# Patient Record
Sex: Female | Born: 1965 | Hispanic: Yes | Marital: Married | Smoking: Never smoker
Health system: Southern US, Community
[De-identification: ages and names within clinical notes are randomized; demographics above are authoritative.]

## PROBLEM LIST (undated history)

## (undated) DIAGNOSIS — I10 Essential (primary) hypertension: Secondary | ICD-10-CM

---

## 2019-04-23 ENCOUNTER — Emergency Department
Admission: EM | Admit: 2019-04-23 | Discharge: 2019-04-23 | Disposition: A | Discharge status: Home / Self Care | Payer: HRSA Program | Attending: Emergency Medicine | Admitting: Emergency Medicine

## 2019-04-23 ENCOUNTER — Other Ambulatory Visit: Payer: Self-pay

## 2019-04-23 ENCOUNTER — Emergency Department: Payer: HRSA Program

## 2019-04-23 ENCOUNTER — Encounter: Payer: Self-pay | Admitting: Emergency Medicine

## 2019-04-23 DIAGNOSIS — R0789 Other chest pain: Secondary | ICD-10-CM | POA: Diagnosis not present

## 2019-04-23 DIAGNOSIS — J029 Acute pharyngitis, unspecified: Secondary | ICD-10-CM | POA: Diagnosis present

## 2019-04-23 DIAGNOSIS — R079 Chest pain, unspecified: Secondary | ICD-10-CM

## 2019-04-23 DIAGNOSIS — Z20828 Contact with and (suspected) exposure to other viral communicable diseases: Secondary | ICD-10-CM | POA: Insufficient documentation

## 2019-04-23 LAB — FIBRIN DERIVATIVES D-DIMER (ARMC ONLY): Fibrin derivatives D-dimer (ARMC): 570 ng/mL (FEU) — ABNORMAL HIGH (ref 0.00–499.00)

## 2019-04-23 LAB — CBC WITH DIFFERENTIAL/PLATELET
Abs Immature Granulocytes: 0.02 10*3/uL (ref 0.00–0.07)
Basophils Absolute: 0 10*3/uL (ref 0.0–0.1)
Basophils Relative: 0 %
Eosinophils Absolute: 0.1 10*3/uL (ref 0.0–0.5)
Eosinophils Relative: 1 %
HCT: 35.7 % — ABNORMAL LOW (ref 36.0–46.0)
Hemoglobin: 11.4 g/dL — ABNORMAL LOW (ref 12.0–15.0)
Immature Granulocytes: 0 %
Lymphocytes Relative: 24 %
Lymphs Abs: 2.3 10*3/uL (ref 0.7–4.0)
MCH: 27.4 pg (ref 26.0–34.0)
MCHC: 31.9 g/dL (ref 30.0–36.0)
MCV: 85.8 fL (ref 80.0–100.0)
Monocytes Absolute: 0.7 10*3/uL (ref 0.1–1.0)
Monocytes Relative: 7 %
Neutro Abs: 6.2 10*3/uL (ref 1.7–7.7)
Neutrophils Relative %: 68 %
Platelets: 438 10*3/uL — ABNORMAL HIGH (ref 150–400)
RBC: 4.16 MIL/uL (ref 3.87–5.11)
RDW: 13.4 % (ref 11.5–15.5)
WBC: 9.3 10*3/uL (ref 4.0–10.5)
nRBC: 0 % (ref 0.0–0.2)

## 2019-04-23 LAB — COMPREHENSIVE METABOLIC PANEL
ALT: 12 U/L (ref 0–44)
AST: 19 U/L (ref 15–41)
Albumin: 4.2 g/dL (ref 3.5–5.0)
Alkaline Phosphatase: 70 U/L (ref 38–126)
Anion gap: 12 (ref 5–15)
BUN: 8 mg/dL (ref 6–20)
CO2: 22 mmol/L (ref 22–32)
Calcium: 9.2 mg/dL (ref 8.9–10.3)
Chloride: 104 mmol/L (ref 98–111)
Creatinine, Ser: 0.56 mg/dL (ref 0.44–1.00)
GFR calc Af Amer: >60 mL/min (ref >60)
GFR calc non Af Amer: >60 mL/min (ref >60)
Glucose, Bld: 121 mg/dL — ABNORMAL HIGH (ref 70–99)
Potassium: 3.4 mmol/L — ABNORMAL LOW (ref 3.5–5.1)
Sodium: 138 mmol/L (ref 135–145)
Total Bilirubin: 0.9 mg/dL (ref 0.3–1.2)
Total Protein: 8.1 g/dL (ref 6.5–8.1)

## 2019-04-23 LAB — TROPONIN I: Troponin I: <0.03 ng/mL (ref <0.03)

## 2019-04-23 LAB — GROUP A STREP BY PCR: Group A Strep by PCR: NOT DETECTED

## 2019-04-23 LAB — SARS CORONAVIRUS 2 BY RT PCR (HOSPITAL ORDER, PERFORMED IN ~~LOC~~ HOSPITAL LAB): SARS Coronavirus 2: NEGATIVE

## 2019-04-23 MED ORDER — IOHEXOL 350 MG/ML SOLN
75.0000 mL | Freq: Once | INTRAVENOUS | Status: AC | PRN
  Administered 2019-04-23: 20:00:00 75 mL via INTRAVENOUS
  Filled 2019-04-23: qty 75

## 2019-04-23 NOTE — ED Notes (Signed)
PT state she went to [place] to see mother one month ago and returned 2 weeks ago.  States she has a brother that was diagnosed with CoVid and that while she was not around him, she was around another family member that had been in contact with him.  Pt reports fever this AM and states she just feels weak.

## 2019-04-23 NOTE — ED Provider Notes (Signed)
[facility] Emergency Department Provider Note  ____________________________________________   First MD Initiated Contact with Patient 04/23/19 1733     (approximate)  I have reviewed the triage vital signs and the nursing notes.   HISTORY  Chief Complaint Sore Throat    HPI Courtney Summers is a 53 y.o. female presents to the emergency department with complaints of sore throat with white patches.  Fever for several days.  Chest pain/pressure along with palpitations.  Patient states she did travel to [place] where she saw family members that had cared for her brother that was positive for COVID.  She states her sense of smell is decreased but is still there.    History reviewed. No pertinent past medical history.  There are no active problems to display for this patient.   History reviewed. No pertinent surgical history.  Prior to Admission medications   Not on File    Allergies Patient has no allergy information on record.  History reviewed. No pertinent family history.  Social History Social History   Tobacco Use  . Smoking status: Never Smoker  . Smokeless tobacco: Never Used  Substance Use Topics  . Alcohol use: Not on file  . Drug use: Not on file    Review of Systems  Constitutional: Positive fever/chills Eyes: No visual changes. ENT: Positive sore throat. Respiratory: Denies cough Cardiovascular: Positive for chest pressure Genitourinary: Negative for dysuria. Musculoskeletal: Negative for back pain. Neurological: Positive decrease in sense of  smell Skin: Negative for rash.    ____________________________________________   PHYSICAL EXAM:  VITAL SIGNS: ED Triage Vitals  Enc Vitals Group     BP 04/23/19 1642 (!) 164/105     Pulse Rate 04/23/19 1642 (!) 109     Resp 04/23/19 1642 18     Temp 04/23/19 1642 98.2 F (36.8 C)     Temp src --      SpO2 04/23/19 1642 100 %     Weight 04/23/19 1643 145 lb (65.8  kg)     Height 04/23/19 1643 5' (1.524 m)     Head Circumference --      Peak Flow --      Pain Score 04/23/19 1643 5     Pain Loc --      Pain Edu? --      Excl. in [place]? --     Constitutional: Alert and oriented. Well appearing and in no acute distress. Eyes: Conjunctivae are normal.  Head: Atraumatic. Nose: No congestion/rhinnorhea. Mouth/Throat: Mucous membranes are moist.  Throat appears normal no red patches are noted Neck:  supple no lymphadenopathy noted Cardiovascular: Normal rate, regular rhythm. Heart sounds are normal Respiratory: Normal respiratory effort.  No retractions, lungs c t a  GU: deferred Musculoskeletal: FROM all extremities, warm and well perfused Neurologic:  Normal speech and language.  Skin:  Skin is warm, dry and intact. No rash noted. Psychiatric: Mood and affect are normal. Speech and behavior are normal.  ____________________________________________   LABS (all labs ordered are listed, but only abnormal results are displayed)  Labs Reviewed  COMPREHENSIVE METABOLIC PANEL - Abnormal; Notable for the following components:      Result Value   Potassium 3.4 (*)    Glucose, Bld 121 (*)    All other components within normal limits  CBC WITH DIFFERENTIAL/PLATELET - Abnormal; Notable for the following components:   Hemoglobin 11.4 (*)    HCT 35.7 (*)    Platelets 438 (*)  All other components within normal limits  FIBRIN DERIVATIVES D-DIMER ([facility] ONLY) - Abnormal; Notable for the following components:   Fibrin derivatives D-dimer ([facility]) 570.00 (*)    All other components within normal limits  GROUP A STREP BY PCR  SARS CORONAVIRUS 2 (HOSPITAL ORDER, [facility])  TROPONIN I   ____________________________________________   ____________________________________________  RADIOLOGY  CHest x-ray is normal CTA of the chest is negative for PE ____________________________________________   PROCEDURES  Procedure(s)  performed: EKG shows normal sinus rhythm   Procedures    ____________________________________________   INITIAL IMPRESSION / ASSESSMENT AND PLAN / ED COURSE  Pertinent labs & imaging results that were available during my care of the patient were reviewed by me and considered in my medical decision making (see chart for details).   Patient is 53 year old female presents emergency department with complaints of sore throat and fever since Wednesday.  White patches in the throat.  Positive chest pressure, positive decrease in's of smell  Physical exam patient appears well.  Exam is actually unremarkable.  CBC, met C, troponin, d-dimer, strep test, COVID-19 test, chest x-ray   DDX: strep throat, uri, covid 19, nonspecific chest pain  ----------------------------------------- 8:25 PM on 04/23/2019 -----------------------------------------  CBC has slightly decreased H&H, comprehensive metabolic panel is basically normal, strep test is negative, COVID-19 test is negative, troponin is normal, d-dimer is elevated at 570.  Due to the chest pain associated with elevated d-dimer, recent travel, and increased pulse CTA for PE was ordered.  ----------------------------------------- 8:59 PM on 04/23/2019 -----------------------------------------  CTA of the chest is negative for PE.  Lungs are clear.  Explained all the labs to the patient.  Explained the CT of the chest to the patient.  She was encouraged to follow-up with her regular doctor or the acute care if not improving in 2 to 3 days.  Return emergency department worsening.  Take Tylenol or ibuprofen for pain as needed.  She was discharged in stable condition.   Courtney Summers was evaluated in Emergency Department on 04/23/2019 for the symptoms described in the history of present illness. She was evaluated in the context of the global COVID-19 pandemic, which necessitated consideration that the patient might be at risk for infection  with the SARS-CoV-2 virus that causes COVID-19. Institutional protocols and algorithms that pertain to the evaluation of patients at risk for COVID-19 are in a state of rapid change based on information released by regulatory bodies including the CDC and federal and state organizations. These policies and algorithms were followed during the patient's care in the ED.   As part of my medical decision making, I reviewed the following data within the [place] notes reviewed and incorporated, Interpreter needed, Labs reviewed see above, EKG interpreted NSR, Old chart reviewed, Radiograph reviewed chest x-ray normal, CTA of the chest negative for PE, Notes from prior ED visits and [place] Controlled Substance Database  ____________________________________________   FINAL CLINICAL IMPRESSION(S) / ED DIAGNOSES  Final diagnoses:  Nonspecific chest pain  Sore throat      NEW MEDICATIONS STARTED DURING THIS VISIT:  New Prescriptions   No medications on file     Note:  This document was prepared using Dragon voice recognition software and may include unintentional dictation errors.    Versie Starks, PA-C 04/23/19 2100    Delman Kitten, MD 04/23/19 [phone]

## 2019-04-23 NOTE — ED Triage Notes (Signed)
Pt to ER with c/o sore throat since Wednesday.  States she noted her throat was "white". Pt also c/o back pain.

## 2020-03-04 ENCOUNTER — Emergency Department
Admission: EM | Admit: 2020-03-04 | Discharge: 2020-03-05 | Disposition: A | Discharge status: Home / Self Care | Payer: Self-pay | Attending: Student in an Organized Health Care Education/Training Program | Admitting: Student in an Organized Health Care Education/Training Program

## 2020-03-04 ENCOUNTER — Emergency Department: Payer: Self-pay

## 2020-03-04 ENCOUNTER — Other Ambulatory Visit: Payer: Self-pay

## 2020-03-04 ENCOUNTER — Encounter: Payer: Self-pay | Admitting: *Deleted

## 2020-03-04 DIAGNOSIS — U071 COVID-19: Secondary | ICD-10-CM | POA: Insufficient documentation

## 2020-03-04 LAB — BASIC METABOLIC PANEL
Anion gap: 10 (ref 5–15)
BUN: 7 mg/dL (ref 6–20)
CO2: 26 mmol/L (ref 22–32)
Calcium: 9.9 mg/dL (ref 8.9–10.3)
Chloride: 101 mmol/L (ref 98–111)
Creatinine, Ser: 0.65 mg/dL (ref 0.44–1.00)
GFR calc Af Amer: >60 mL/min (ref >60)
GFR calc non Af Amer: >60 mL/min (ref >60)
Glucose, Bld: 102 mg/dL — ABNORMAL HIGH (ref 70–99)
Potassium: 4 mmol/L (ref 3.5–5.1)
Sodium: 137 mmol/L (ref 135–145)

## 2020-03-04 LAB — CBC
HCT: 42.4 % (ref 36.0–46.0)
Hemoglobin: 13.6 g/dL (ref 12.0–15.0)
MCH: 27.6 pg (ref 26.0–34.0)
MCHC: 32.1 g/dL (ref 30.0–36.0)
MCV: 86 fL (ref 80.0–100.0)
Platelets: 387 10*3/uL (ref 150–400)
RBC: 4.93 MIL/uL (ref 3.87–5.11)
RDW: 13 % (ref 11.5–15.5)
WBC: 5 10*3/uL (ref 4.0–10.5)
nRBC: 0 % (ref 0.0–0.2)

## 2020-03-04 LAB — TROPONIN I (HIGH SENSITIVITY): Troponin I (High Sensitivity): 3 ng/L (ref <18)

## 2020-03-04 MED ORDER — SODIUM CHLORIDE 0.9 % IV BOLUS
500.0000 mL | Freq: Once | INTRAVENOUS | Status: AC
  Administered 2020-03-04: 22:00:00 500 mL via INTRAVENOUS

## 2020-03-04 MED ORDER — BENZONATATE 100 MG PO CAPS: 100.0000 mg | ORAL_CAPSULE | Freq: Three times a day (TID) | ORAL | 0 refills | Status: AC | PRN

## 2020-03-04 NOTE — ED Provider Notes (Signed)
[facility] Emergency Department Provider Note  ____________________________________________  Time seen: Approximately 9:42 PM  I have reviewed the triage vital signs and the nursing notes.   HISTORY  Chief Complaint covid    HPI Courtney Summers is a 54 y.o. female that presents to the emergency department for evaluation of COVID-19.  Patient went to [facility] today for Covid testing and had a positive test.  She went to [facility] to get tested because her husband and grandchild have COVID-14.  [facility] told her that her heart rate was fast and she should come to the emergency department.  Patient states that she has had a productive cough with clear phlegm for several days.  She did have 5 episodes of vomiting today.  No fevers, nasal congestion, shortness of breath, chest pain, vomiting, abdominal pain.   No past medical history on file.  There are no problems to display for this patient.   No past surgical history on file.  Prior to Admission medications   Medication Sig Start Date End Date Taking? Authorizing Provider  benzonatate (TESSALON PERLES) 100 MG capsule Take 1 capsule (100 mg total) by mouth 3 (three) times daily as needed. 03/04/20 03/04/21  Laban Emperor, PA-C    Allergies Patient has no known allergies.  No family history on file.  Social History Social History   Tobacco Use  . Smoking status: Never Smoker  . Smokeless tobacco: Never Used  Substance Use Topics  . Alcohol use: Not Currently  . Drug use: Not Currently     Review of Systems  Constitutional: No fever/chills Eyes: No visual changes. No discharge. ENT: Negative for congestion and rhinorrhea. Cardiovascular: No chest pain. Respiratory: Positive for cough. No SOB. Gastrointestinal: No abdominal pain.  No nausea, no vomiting.  Positive for diarrhea.  No constipation. Musculoskeletal: Negative for musculoskeletal pain. Skin: Negative for rash, abrasions, lacerations,  ecchymosis. Neurological: Negative for headaches.   ____________________________________________   PHYSICAL EXAM:  VITAL SIGNS: ED Triage Vitals  Enc Vitals Group     BP 03/04/20 1933 (!) 139/94     Pulse Rate 03/04/20 1933 (!) 105     Resp 03/04/20 1933 20     Temp 03/04/20 1933 98.5 F (36.9 C)     Temp Source 03/04/20 1933 Oral     SpO2 03/04/20 1933 99 %     Weight 03/04/20 1934 160 lb (72.6 kg)     Height 03/04/20 1934 5' (1.524 m)     Head Circumference --      Peak Flow --      Pain Score --      Pain Loc --      Pain Edu? --      Excl. in [place]? --      Constitutional: Alert and oriented. Well appearing and in no acute distress. Eyes: Conjunctivae are normal. PERRL. EOMI. No discharge. Head: Atraumatic. ENT: No frontal and maxillary sinus tenderness.      Ears: Tympanic membranes pearly gray with good landmarks. No discharge.      Nose: No congestion/rhinnorhea.      Mouth/Throat: Mucous membranes are moist. Oropharynx non-erythematous. Tonsils not enlarged. No exudates. Uvula midline. Neck: No stridor.   Hematological/Lymphatic/Immunilogical: No cervical lymphadenopathy. Cardiovascular: Normal rate, regular rhythm.  Good peripheral circulation. Respiratory: Normal respiratory effort without tachypnea or retractions. Lungs CTAB. Good air entry to the bases with no decreased or absent breath sounds. Gastrointestinal: Bowel sounds 4 quadrants. Soft and nontender to palpation. No guarding or rigidity.  No palpable masses. No distention. Musculoskeletal: Full range of motion to all extremities. No gross deformities appreciated. Neurologic:  Normal speech and language. No gross focal neurologic deficits are appreciated.  Skin:  Skin is warm, dry and intact. No rash noted. Psychiatric: Mood and affect are normal. Speech and behavior are normal. Patient exhibits appropriate insight and judgement.   ____________________________________________   LABS (all labs ordered  are listed, but only abnormal results are displayed)  Labs Reviewed  BASIC METABOLIC PANEL - Abnormal; Notable for the following components:      Result Value   Glucose, Bld 102 (*)    All other components within normal limits  CBC  TROPONIN I (HIGH SENSITIVITY)   ____________________________________________  EKG   ____________________________________________  RADIOLOGY Lexine Baton, personally viewed and evaluated these images (plain radiographs) as part of my medical decision making, as well as reviewing the written report by the radiologist.  DG Chest 1 View  Result Date: 03/04/2020 CLINICAL DATA:  Initial evaluation for acute COVID infection. EXAM: CHEST  1 VIEW COMPARISON:  Prior radiograph from 04/23/2019. FINDINGS: The cardiac and mediastinal silhouettes are stable in size and contour, and remain within normal limits. The lungs are normally inflated. No airspace consolidation, pleural effusion, or pulmonary edema. No pneumothorax. No acute osseous abnormality. Mild thoracolumbar scoliosis noted. Cholecystectomy clips overlie the right upper quadrant. IMPRESSION: No radiographic evidence for active cardiopulmonary disease. Electronically Signed   By: Rise Mu M.D.   On: 03/04/2020 20:07    ____________________________________________    PROCEDURES  Procedure(s) performed:    Procedures    Medications  sodium chloride 0.9 % bolus 500 mL (0 mLs Intravenous Stopped 03/04/20 2358)     ____________________________________________   INITIAL IMPRESSION / ASSESSMENT AND PLAN / ED COURSE  Pertinent labs & imaging results that were available during my care of the patient were reviewed by me and considered in my medical decision making (see chart for details).  Review of the [place] CSRS was performed in accordance of the [facility] prior to dispensing any controlled drugs.    Patient presented to the emergency department for evaluation of COVID-19. Vital signs and  exam are reassuring.  Chest x-ray negative for acute cardiopulmonary processes.  EKG shows normal sinus rhythm.  CBC and CMP are largely unremarkable.  Troponin within normal limits.  Patient [facility] rate.  Patient's heart rate on arrival to the emergency department was between 101 05.  Patient had 5 episodes of diarrhea today so was given IV fluids for possible dehydration.  HR 90 following fluids.  Patient appears well.  She denies shortness of breath, chest pain, abdominal pain.Patient feels comfortable going home. Patient will be discharged home with prescriptions for [facility]. Patient is to follow up with primary care as needed or otherwise directed. Patient is given ED precautions to return to the ED for any worsening or new symptoms.  Fonda Rochon was evaluated in Emergency Department on 03/05/2020 for the symptoms described in the history of present illness. She was evaluated in the context of the global COVID-19 pandemic, which necessitated consideration that the patient might be at risk for infection with the SARS-CoV-2 virus that causes COVID-19. Institutional protocols and algorithms that pertain to the evaluation of patients at risk for COVID-19 are in a state of rapid change based on information released by regulatory bodies including the CDC and federal and state organizations. These policies and algorithms were  followed during the patient's care in the ED.   ____________________________________________  FINAL CLINICAL IMPRESSION(S) / ED DIAGNOSES  Final diagnoses:  COVID-19      NEW MEDICATIONS STARTED DURING THIS VISIT:  ED Discharge Orders         Ordered    benzonatate (TESSALON PERLES) 100 MG capsule  3 times daily PRN     03/04/20 2309              This chart was dictated using voice recognition software/Dragon. Despite best efforts to proofread, errors can occur which can change the  meaning. Any change was purely unintentional.    Enid Derry, PA-C 03/05/20 0017    Willy Eddy, MD 03/09/20 [phone]

## 2020-03-04 NOTE — ED Triage Notes (Signed)
Pt had positive covid test 2 hours ago at [facility].  Pt has slight cough.   No sob.  No chest pain.  Pt's family has covid.  Pt alert  Speech clear.  Interpreter with pt.

## 2020-04-03 IMAGING — CT CT ANGIOGRAPHY CHEST
2 of 6 series · 18 of 46 positions shown · IV contrast (omnipaque)
Comparison: None.

CLINICAL DATA: Shortness of breath with concern for pulmonary
emboli.

EXAM:
CT ANGIOGRAPHY CHEST WITH CONTRAST
TECHNIQUE: Multidetector CT imaging of the chest was performed using the
standard protocol during bolus administration of intravenous
contrast. Multiplanar CT image reconstructions and MIPs were
obtained to evaluate the vascular anatomy.
CONTRAST:  75mL OMNIPAQUE IOHEXOL 350 MG/ML SOLN

[Series 5: thins · axial · 0.59mm/px · z∈[-533,-330]mm · 16 of 223 slices shown]
[im 10/223  lung]
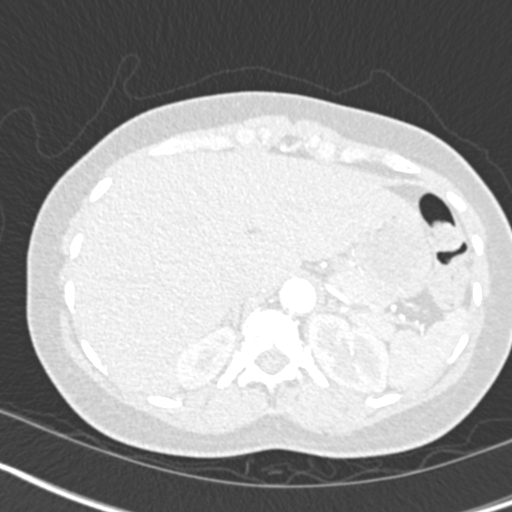
[im 29/223  soft-tissue]
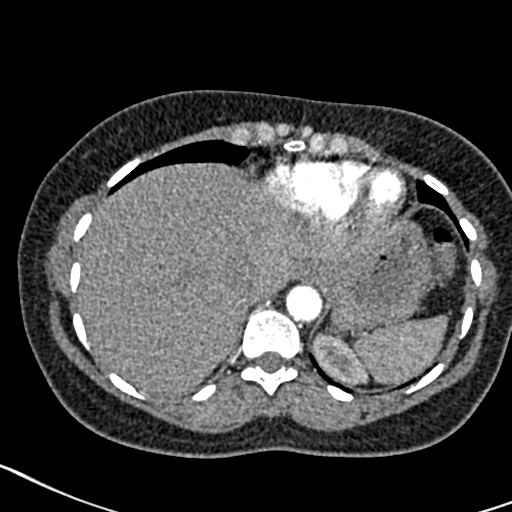
[im 39/223  lung]
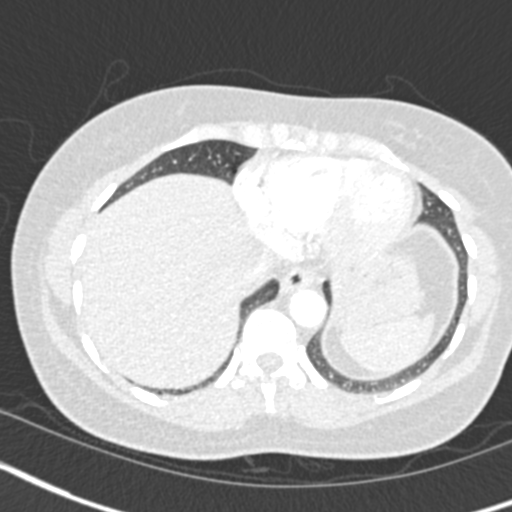
[im 49/223  soft-tissue]
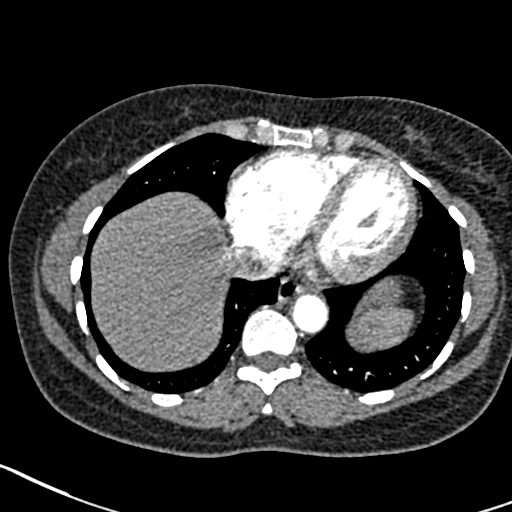
[im 68/223  lung]
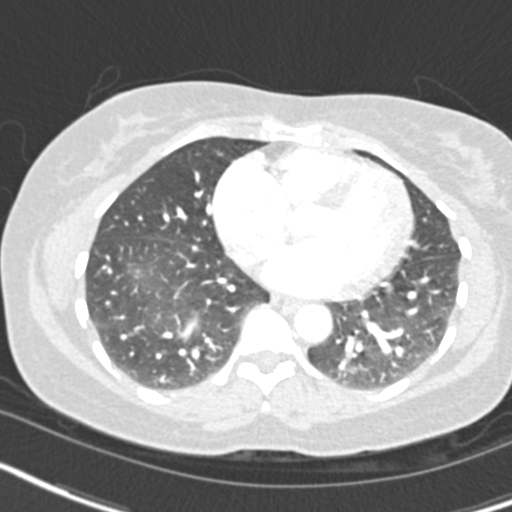
[im 78/223  soft-tissue]
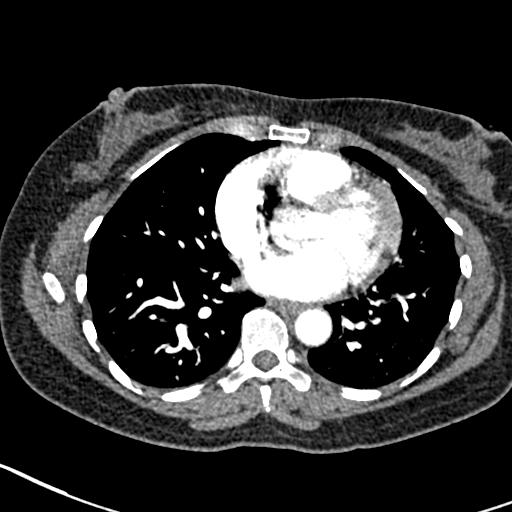
[im 87/223  lung]
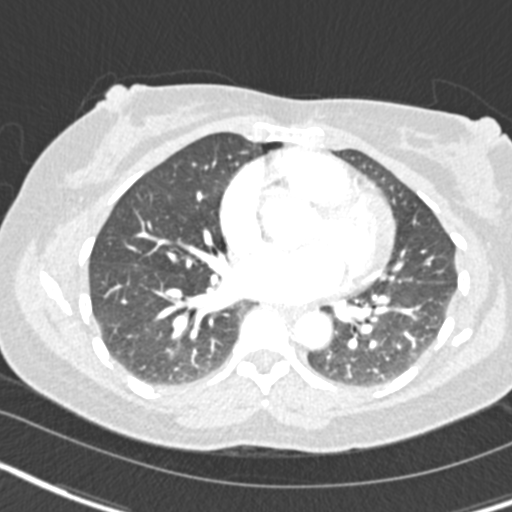
[im 107/223  soft-tissue]
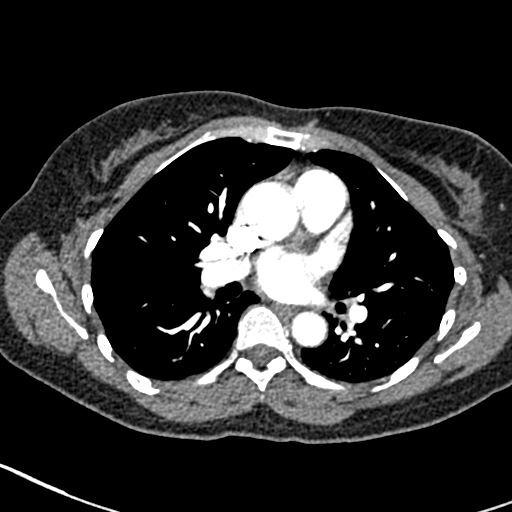
[im 116/223  lung]
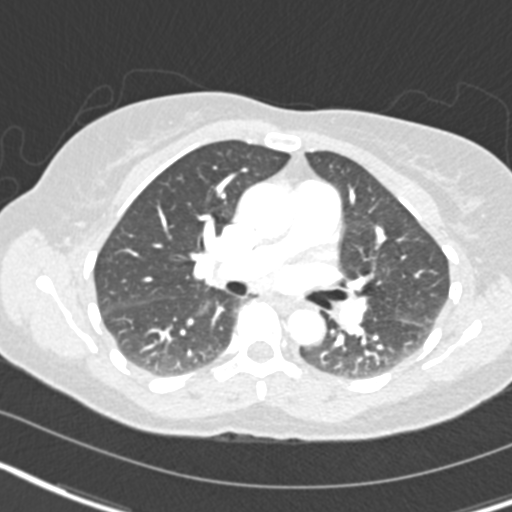
[im 136/223  soft-tissue]
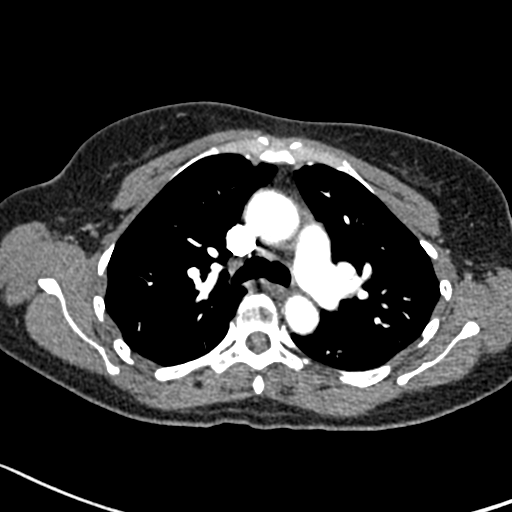
[im 145/223  lung]
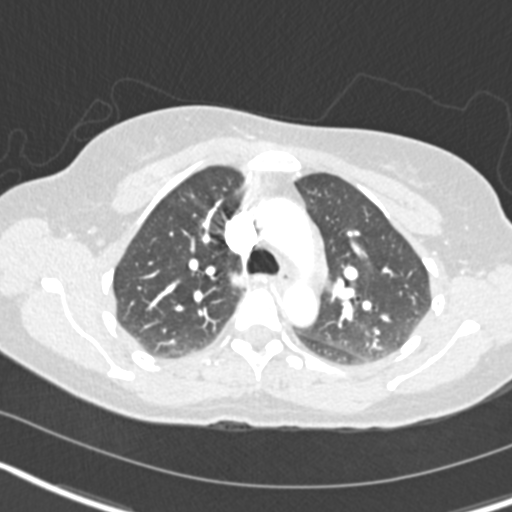
[im 155/223  soft-tissue]
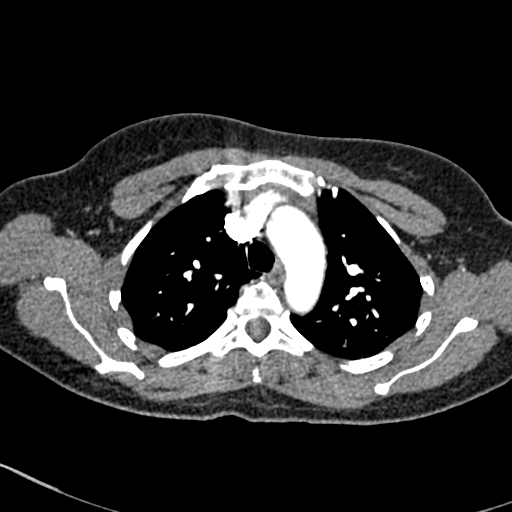
[im 174/223  lung]
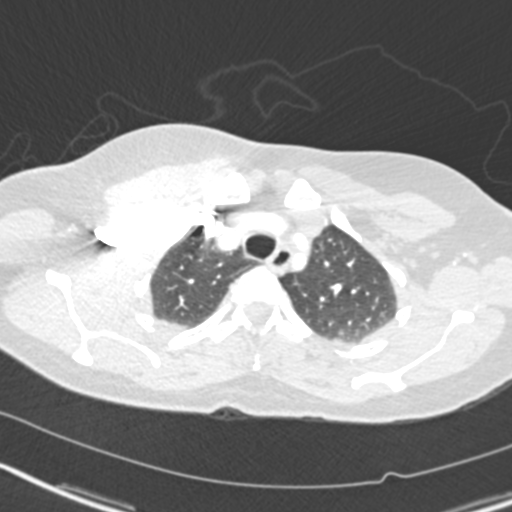
[im 184/223  soft-tissue]
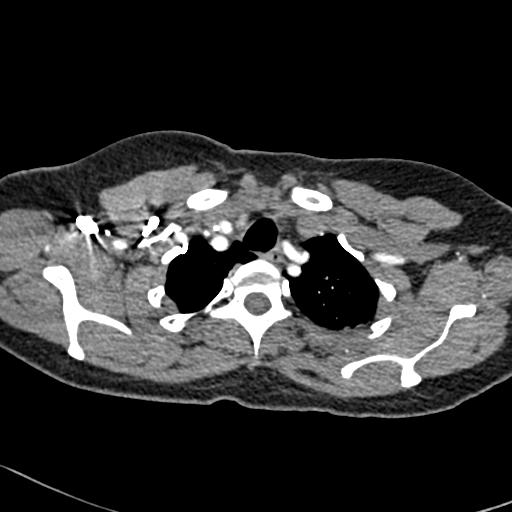
[im 194/223  lung]
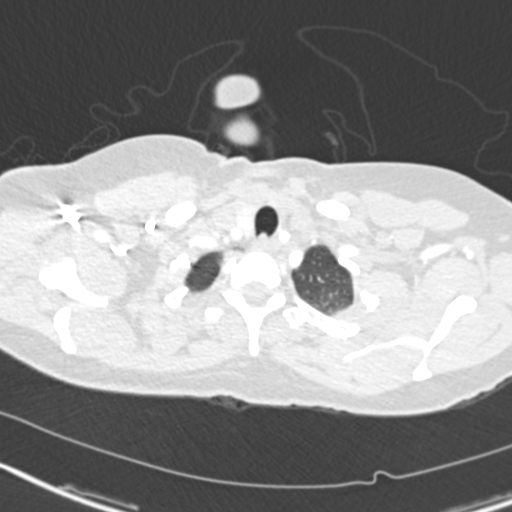
[im 213/223  soft-tissue]
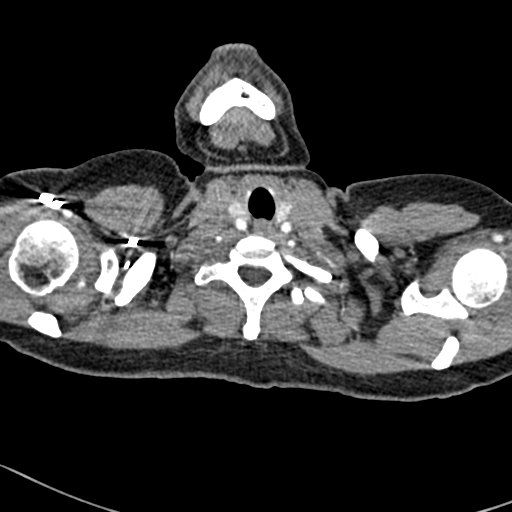

[Series 7: coronal mpr · coronal · 0.46mm/px · 2 of 66 slices shown]
[im 22/66  soft-tissue]
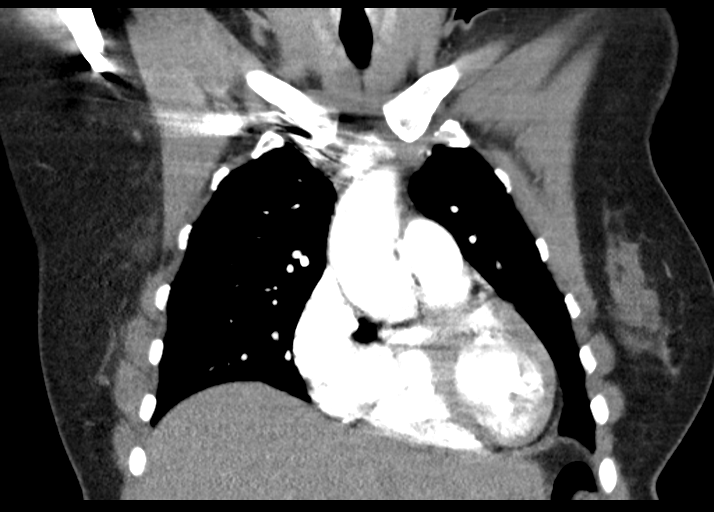
[im 44/66  soft-tissue]
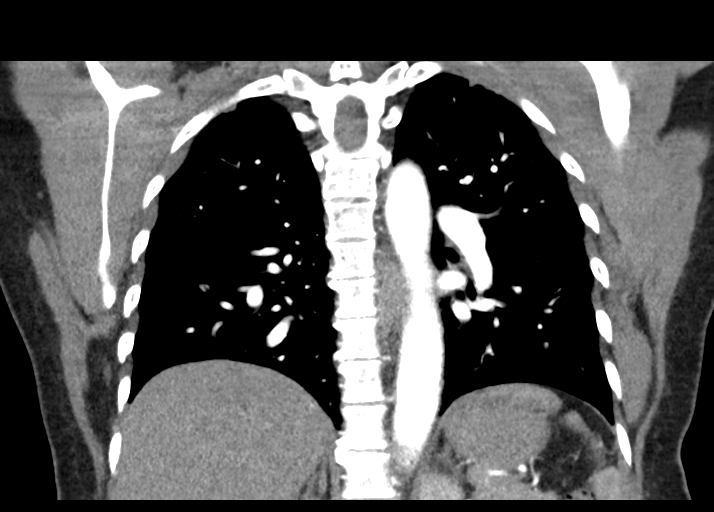

[18 of 46 positions shown; findings below may reference images not displayed]

FINDINGS: Cardiovascular: Satisfactory opacification of the pulmonary arteries
to the segmental level. No evidence of pulmonary embolism. Normal
heart size. No pericardial effusion.

Mediastinum/Nodes: No enlarged mediastinal, hilar, or axillary lymph
nodes. Thyroid gland, trachea, and esophagus demonstrate no
significant findings.

Lungs/Pleura: Lungs are clear. No pleural effusion or pneumothorax.

Upper Abdomen: No acute abnormality.

Musculoskeletal: No chest wall abnormality. No acute or significant
osseous findings.

Review of the MIP images confirms the above findings.
IMPRESSION: Normal study.  No PE.  The lungs are clear.

## 2020-04-21 ENCOUNTER — Emergency Department
Admission: EM | Admit: 2020-04-21 | Discharge: 2020-04-22 | Disposition: A | Discharge status: Home / Self Care | Payer: Medicaid Other | Attending: Emergency Medicine | Admitting: Emergency Medicine

## 2020-04-21 ENCOUNTER — Emergency Department: Payer: Medicaid Other

## 2020-04-21 ENCOUNTER — Encounter: Payer: Self-pay | Admitting: Emergency Medicine

## 2020-04-21 DIAGNOSIS — Z8616 Personal history of COVID-19: Secondary | ICD-10-CM | POA: Insufficient documentation

## 2020-04-21 DIAGNOSIS — J069 Acute upper respiratory infection, unspecified: Secondary | ICD-10-CM | POA: Insufficient documentation

## 2020-04-21 NOTE — ED Triage Notes (Signed)
Triage with interpreter and pt: Pt was diagnosed with COVID on April 28. Pt took second test on may 1 and result was negative. Pt to ED tonight due to symptoms of upper back pain and cough, worse when lying down. Pt sts, "I just want to know if I still have it and what I can do to make it better." No cough present in triage, pt with clear speech, no respiratory distress noted.

## 2020-04-22 MED ORDER — AZITHROMYCIN 500 MG PO TABS: 500.0000 mg | ORAL_TABLET | Freq: Every day | ORAL | 0 refills | Status: AC

## 2020-04-22 MED ORDER — ALBUTEROL SULFATE HFA 108 (90 BASE) MCG/ACT IN AERS: 2.0000 | INHALATION_SPRAY | Freq: Four times a day (QID) | RESPIRATORY_TRACT | 0 refills | Status: AC | PRN

## 2020-04-22 NOTE — ED Provider Notes (Signed)
[facility] Emergency Department Provider Note  ____________________________________________   First MD Initiated Contact with Patient 04/22/20 0133     (approximate)  I have reviewed the triage vital signs and the nursing notes.  History obtained via Spanish interpreter HISTORY  Chief Complaint Back Pain    HPI Courtney Summers is a 54 y.o. female with past medical history of COVID-19 infection on April 28 presents to the emergency department the 2-week history of nasal congestion rhinorrhea nonproductive cough and dyspnea.  Patient denies any fever.  Patient is markedly concerned of the possibility she may have Covid again however she did receive an additional test on May 1 that was negative.        History reviewed. No pertinent past medical history.  There are no problems to display for this patient.   History reviewed. No pertinent surgical history.  Prior to Admission medications   Medication Sig Start Date End Date Taking? Authorizing Provider  benzonatate (TESSALON PERLES) 100 MG capsule Take 1 capsule (100 mg total) by mouth 3 (three) times daily as needed. 03/04/20 03/04/21  Enid Derry, PA-C    Allergies Patient has no known allergies.  History reviewed. No pertinent family history.  Social History Social History   Tobacco Use  . Smoking status: Never Smoker  . Smokeless tobacco: Never Used  Substance Use Topics  . Alcohol use: Not Currently  . Drug use: Not Currently    Review of Systems Constitutional: No fever/chills Eyes: No visual changes. ENT: No sore throat. Cardiovascular: Denies chest pain. Respiratory: Denies shortness of breath. Gastrointestinal: No abdominal pain.  No nausea, no vomiting.  No diarrhea.  No constipation. Genitourinary: Negative for dysuria. Musculoskeletal: Negative for neck pain.  Negative for back pain. Integumentary: Negative for rash. Neurological: Negative for headaches, focal weakness  or numbness.   ____________________________________________   PHYSICAL EXAM:  VITAL SIGNS: ED Triage Vitals [04/21/20 2049]  Enc Vitals Group     BP (!) 153/86     Pulse Rate (!) 119     Resp 16     Temp 98.7 F (37.1 C)     Temp Source Oral     SpO2 99 %     Weight      Height      Head Circumference      Peak Flow      Pain Score      Pain Loc      Pain Edu?      Excl. in [place]?     Constitutional: Alert and oriented.  Eyes: Conjunctivae are normal.  Head: Atraumatic. Mouth/Throat: Patient is wearing a mask. Neck: No stridor.  No meningeal signs.   Cardiovascular: Normal rate, regular rhythm. Good peripheral circulation. Grossly normal heart sounds. Respiratory: Normal respiratory effort.  No retractions. Gastrointestinal: Soft and nontender. No distention.  Musculoskeletal: No lower extremity tenderness nor edema. No gross deformities of extremities. Neurologic:  Normal speech and language. No gross focal neurologic deficits are appreciated.  Skin:  Skin is warm, dry and intact. Psychiatric: Anxious affect.  Speech and behavior are normal.  _____________________________  RADIOLOGY I, [place] Dewayne Shorter, personally viewed and evaluated these images (plain radiographs) as part of my medical decision making, as well as reviewing the written report by the radiologist.  ED MD interpretation: Normal chest x-ray per radiologist.  Official radiology report(s): DG Chest 2 View  Result Date: 04/21/2020 CLINICAL DATA:  COVID EXAM: CHEST - 2 VIEW COMPARISON:  03/04/2020 FINDINGS: The heart  size and mediastinal contours are within normal limits. Both lungs are clear. The visualized skeletal structures are unremarkable. IMPRESSION: Normal study Electronically Signed   By: Rolm Baptise M.D.   On: 04/21/2020 21:15      Procedures   ____________________________________________   INITIAL IMPRESSION / MDM / [place] / ED COURSE  As part of my medical decision  making, I reviewed the following data within the electronic MEDICAL RECORD NUMBER  54 year old female presented with above-stated history and physical exam differential diagnosis including but not limited to URI, sinusitis, bronchitis and less likely pneumonia.  Chest x-ray revealed no acute findings per radiologist.  ____________________________________________  FINAL CLINICAL IMPRESSION(S) / ED DIAGNOSES  Final diagnoses:  Upper respiratory tract infection, unspecified type     MEDICATIONS GIVEN DURING THIS VISIT:  Medications - No data to display   ED Discharge Orders    None      *Please note:  Courtney Summers was evaluated in Emergency Department on 04/22/2020 for the symptoms described in the history of present illness. She was evaluated in the context of the global COVID-19 pandemic, which necessitated consideration that the patient might be at risk for infection with the SARS-CoV-2 virus that causes COVID-19. Institutional protocols and algorithms that pertain to the evaluation of patients at risk for COVID-19 are in a state of rapid change based on information released by regulatory bodies including the CDC and federal and state organizations. These policies and algorithms were followed during the patient's care in the ED.  Some ED evaluations and interventions may be delayed as a result of limited staffing during the pandemic.*  Note:  This document was prepared using Dragon voice recognition software and may include unintentional dictation errors.   Gregor Hams, MD 04/22/20 [phone]

## 2021-02-13 IMAGING — DX DG CHEST 1V
1 series · 1 of 1 positions shown · non-contrast
Comparison: Prior radiograph from 04/23/2019.

CLINICAL DATA: Initial evaluation for acute COVID infection.

EXAM:
CHEST  1 VIEW

[chest ap]
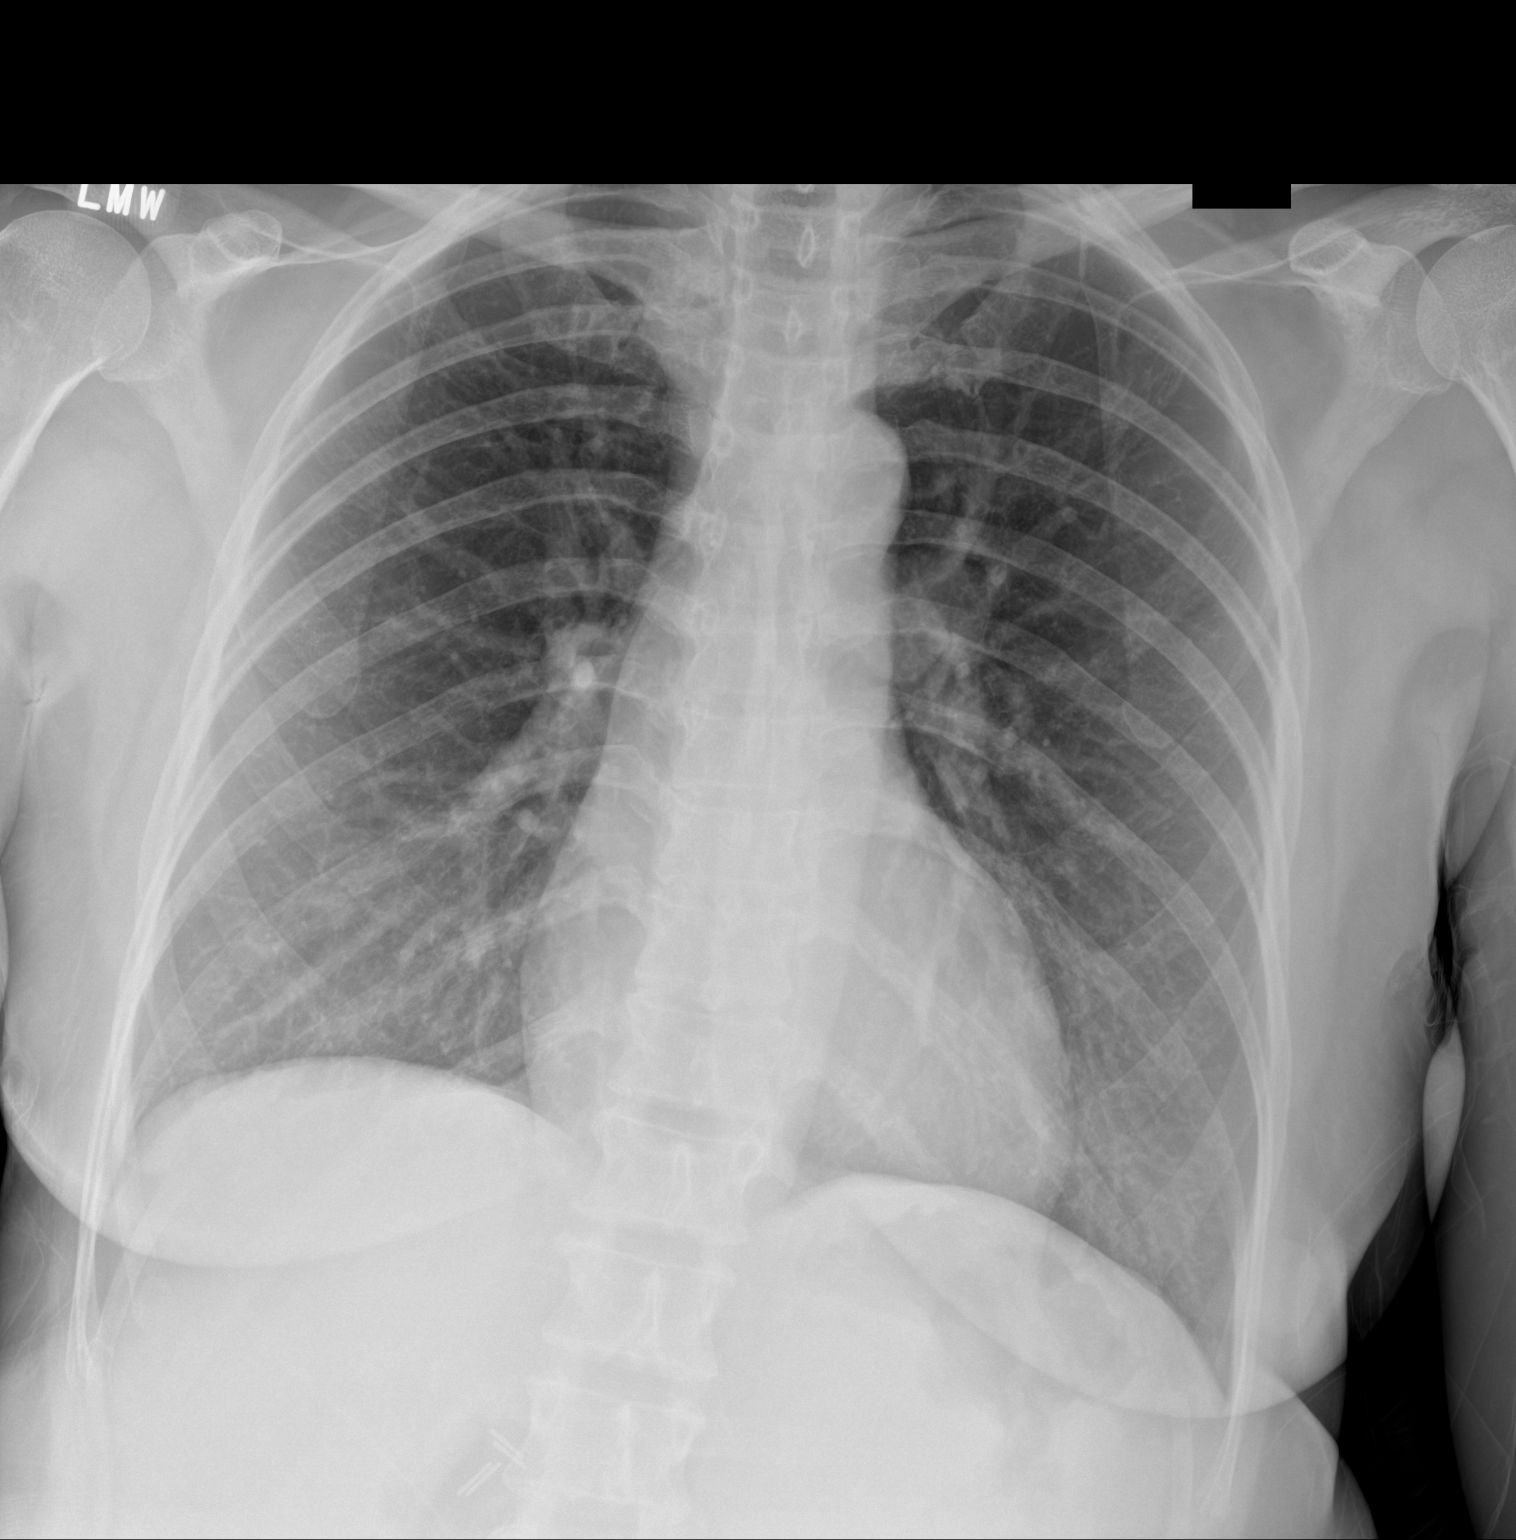

[1 of 1 positions shown; findings below may reference images not displayed]

FINDINGS: The cardiac and mediastinal silhouettes are stable in size and
contour, and remain within normal limits.

The lungs are normally inflated. No airspace consolidation, pleural
effusion, or pulmonary edema. No pneumothorax.

No acute osseous abnormality. Mild thoracolumbar scoliosis noted.
Cholecystectomy clips overlie the right upper quadrant.
IMPRESSION: No radiographic evidence for active cardiopulmonary disease.

## 2021-04-17 ENCOUNTER — Emergency Department
Admission: EM | Admit: 2021-04-17 | Discharge: 2021-04-17 | Disposition: A | Discharge status: Home / Self Care | Payer: Self-pay | Attending: Emergency Medicine | Admitting: Emergency Medicine

## 2021-04-17 ENCOUNTER — Other Ambulatory Visit: Payer: Self-pay

## 2021-04-17 DIAGNOSIS — H1033 Unspecified acute conjunctivitis, bilateral: Secondary | ICD-10-CM | POA: Insufficient documentation

## 2021-04-17 MED ORDER — CIPROFLOXACIN HCL 0.3 % OP SOLN
1.0000 [drp] | OPHTHALMIC | Status: DC
  Administered 2021-04-17: 1 [drp] via OPHTHALMIC
  Filled 2021-04-17: qty 2.5

## 2021-04-17 MED ORDER — CIPROFLOXACIN HCL 0.3 % OP SOLN: 1.0000 [drp] | OPHTHALMIC | 0 refills | Status: AC

## 2021-04-17 NOTE — ED Triage Notes (Signed)
Pt presents to ER c/o eye irritation for a few days.  Pt states her eyes are burning, and feels like she is having a lot of drainage coming from her eyes.  Pt also states she feels like she has inflammation on the sides of her jaw.  Pt's eyes are notably red and draining.  Pt states her son and grandaughter have also had an "eye infection" recently.

## 2021-04-17 NOTE — ED Notes (Signed)
Patient reports taking allergy medication PTA without improvement.

## 2021-04-17 NOTE — ED Notes (Signed)
Interpreter used for H&P, and assessment. Patient reports irritation to both eyes x5 days. Patient is noted to have red conjunctiva bilaterally, with some drainage as well.

## 2021-04-17 NOTE — ED Provider Notes (Signed)
[facility] Emergency Department Provider Note  ____________________________________________  Time seen: Approximately 9:35 PM  I have reviewed the triage vital signs and the nursing notes.   HISTORY  Chief Complaint Eye Pain    HPI Courtney Summers is a 55 y.o. female who presents the emergency department complaining of bilateral eye irritation, drainage.  She states that both her son and grandchild have similar symptoms, they had received eyedrops and are improving.  No visual changes.  She wears glasses but no contacts.  Patient states that she has pain or burning with behind her eyes.  No headache.  No visual changes.  No medications prior to arrival.  No other complaints at this time.       History reviewed. No pertinent past medical history.  There are no problems to display for this patient.   History reviewed. No pertinent surgical history.  Prior to Admission medications   Medication Sig Start Date End Date Taking? Authorizing Provider  ciprofloxacin (CILOXAN) 0.3 % ophthalmic solution Place 1 drop into both eyes every 2 (two) hours. Administer 1 drop, every 2 hours, while awake, for 2 days. Then 1 drop, every 4 hours, while awake, for the next 5 days. 04/17/21  Yes Ansley Stanwood, Delorise Royals, PA-C  albuterol (VENTOLIN HFA) 108 (90 Base) MCG/ACT inhaler Inhale 2 puffs into the lungs every 6 (six) hours as needed for wheezing or shortness of breath. 04/22/20   Darci Current, MD    Allergies B complex-b12  History reviewed. No pertinent family history.  Social History Social History   Tobacco Use   Smoking status: Never   Smokeless tobacco: Never  Substance Use Topics   Alcohol use: Not Currently   Drug use: Not Currently     Review of Systems  Constitutional: No fever/chills Eyes: No visual changes.  Bilateral discharge ENT: No upper respiratory complaints. Cardiovascular: no chest pain. Respiratory: no cough. No  SOB. Gastrointestinal: No abdominal pain.  No nausea, no vomiting.  No diarrhea.  No constipation. Musculoskeletal: Negative for musculoskeletal pain. Skin: Negative for rash, abrasions, lacerations, ecchymosis. Neurological: Negative for headaches, focal weakness or numbness.  10 System ROS otherwise negative.  ____________________________________________   PHYSICAL EXAM:  VITAL SIGNS: ED Triage Vitals  Enc Vitals Group     BP 04/17/21 1912 (!) 154/100     Pulse Rate 04/17/21 1912 (!) 102     Resp 04/17/21 1912 17     Temp 04/17/21 1912 99.5 F (37.5 C)     Temp Source 04/17/21 1912 Oral     SpO2 04/17/21 1912 100 %     Weight 04/17/21 1910 140 lb (63.5 kg)     Height 04/17/21 1910 4\' 11"  (1.499 m)     Head Circumference --      Peak Flow --      Pain Score 04/17/21 1911 3     Pain Loc --      Pain Edu? --      Excl. in GC? --      Constitutional: Alert and oriented. Well appearing and in no acute distress. Eyes: Conjunctivae are erythematous bilaterally.  Purulent drainage noted bilaterally.  PERRL. EOMI. funduscopic exam with no acute findings. Head: Atraumatic. ENT:      Ears:       Nose: No congestion/rhinnorhea.      Mouth/Throat: Mucous membranes are moist.  Neck: No stridor.   Cardiovascular: Normal rate, regular rhythm. Normal S1 and S2.  Good peripheral circulation. Respiratory: Normal respiratory  effort without tachypnea or retractions. Lungs CTAB. Good air entry to the bases with no decreased or absent breath sounds. Musculoskeletal: Full range of motion to all extremities. No gross deformities appreciated. Neurologic:  Normal speech and language. No gross focal neurologic deficits are appreciated.  Skin:  Skin is warm, dry and intact. No rash noted. Psychiatric: Mood and affect are normal. Speech and behavior are normal. Patient exhibits appropriate insight and judgement.   ____________________________________________   LABS (all labs ordered are  listed, but only abnormal results are displayed)  Labs Reviewed - No data to display ____________________________________________  EKG   ____________________________________________  RADIOLOGY   No results found.  ____________________________________________    PROCEDURES  Procedure(s) performed:    Procedures    Medications  ciprofloxacin (CILOXAN) 0.3 % ophthalmic solution 1 drop (has no administration in time range)     ____________________________________________   INITIAL IMPRESSION / ASSESSMENT AND PLAN / ED COURSE  Pertinent labs & imaging results that were available during my care of the patient were reviewed by me and considered in my medical decision making (see chart for details).  Review of the [place] CSRS was performed in accordance of the [facility] prior to dispensing any controlled drugs.           Patient's diagnosis is consistent with bilateral conjunctivitis.  Patient presented to the emergency department complaining of bilateral eye irritation and drainage.  Started in the right eye and now in the left as well.  Patient has 2 family members with similar symptoms.  Findings were consistent with bacterial conjunctivitis and patient will be started on eyedrops at this time.  Follow-up with primary care as needed.  Return precautions discussed with the patient..  Patient is given ED precautions to return to the ED for any worsening or new symptoms.     ____________________________________________  FINAL CLINICAL IMPRESSION(S) / ED DIAGNOSES  Final diagnoses:  Acute bacterial conjunctivitis of both eyes      NEW MEDICATIONS STARTED DURING THIS VISIT:  ED Discharge Orders          Ordered    ciprofloxacin (CILOXAN) 0.3 % ophthalmic solution  Every 2 hours        04/17/21 2140                This chart was dictated using voice recognition software/Dragon. Despite best efforts to proofread, errors can occur which can change the  meaning. Any change was purely unintentional.    Racheal Patches, PA-C 04/17/21 2140    Merwyn Katos, MD 04/17/21 [phone]

## 2021-12-20 DIAGNOSIS — R030 Elevated blood-pressure reading, without diagnosis of hypertension: Secondary | ICD-10-CM | POA: Diagnosis not present

## 2021-12-20 DIAGNOSIS — J029 Acute pharyngitis, unspecified: Secondary | ICD-10-CM | POA: Diagnosis not present

## 2021-12-20 DIAGNOSIS — Z03818 Encounter for observation for suspected exposure to other biological agents ruled out: Secondary | ICD-10-CM | POA: Diagnosis not present

## 2021-12-20 DIAGNOSIS — J0140 Acute pansinusitis, unspecified: Secondary | ICD-10-CM | POA: Diagnosis not present

## 2021-12-20 DIAGNOSIS — Z20822 Contact with and (suspected) exposure to covid-19: Secondary | ICD-10-CM | POA: Diagnosis not present

## 2024-10-26 ENCOUNTER — Emergency Department (HOSPITAL_BASED_OUTPATIENT_CLINIC_OR_DEPARTMENT_OTHER)

## 2024-10-26 ENCOUNTER — Encounter (HOSPITAL_BASED_OUTPATIENT_CLINIC_OR_DEPARTMENT_OTHER): Payer: Self-pay | Admitting: Emergency Medicine

## 2024-10-26 ENCOUNTER — Emergency Department (HOSPITAL_BASED_OUTPATIENT_CLINIC_OR_DEPARTMENT_OTHER)
Admission: EM | Admit: 2024-10-26 | Discharge: 2024-10-26 | Disposition: A | Discharge status: Home / Self Care | Attending: Emergency Medicine | Admitting: Emergency Medicine

## 2024-10-26 ENCOUNTER — Other Ambulatory Visit: Payer: Self-pay

## 2024-10-26 DIAGNOSIS — M545 Low back pain, unspecified: Secondary | ICD-10-CM | POA: Diagnosis present

## 2024-10-26 HISTORY — DX: Essential (primary) hypertension: I10

## 2024-10-26 MED ORDER — LIDOCAINE 5 % EX PTCH
1.0000 | MEDICATED_PATCH | Freq: Once | CUTANEOUS | Status: DC
  Administered 2024-10-26: 1 via TRANSDERMAL
  Filled 2024-10-26 (×2): qty 1

## 2024-10-26 MED ORDER — CYCLOBENZAPRINE HCL 10 MG PO TABS: 5.0000 mg | ORAL_TABLET | Freq: Every evening | ORAL | 0 refills | Status: AC | PRN

## 2024-10-26 MED ORDER — LIDOCAINE 5 % EX PTCH: 1.0000 | MEDICATED_PATCH | CUTANEOUS | Status: DC

## 2024-10-26 MED ORDER — ACETAMINOPHEN 500 MG PO TABS
1000.0000 mg | ORAL_TABLET | Freq: Once | ORAL | Status: DC
  Filled 2024-10-26: qty 2

## 2024-10-26 MED ORDER — OXYCODONE HCL 5 MG PO TABS
5.0000 mg | ORAL_TABLET | Freq: Once | ORAL | Status: DC
  Filled 2024-10-26: qty 1

## 2024-10-26 MED ORDER — LIDOCAINE 5 % EX PTCH: 1.0000 | MEDICATED_PATCH | CUTANEOUS | 0 refills | Status: AC

## 2024-10-26 MED ORDER — PREDNISONE 10 MG PO TABS: ORAL_TABLET | ORAL | 0 refills | Status: AC

## 2024-10-26 NOTE — Discharge Instructions (Addendum)
 El dolor de espalda y pierna derecha parece deberse a la irritacin de un nervio (citica).  Realice actividad fsica ligera (como caminar) para evitar que el dolor de espalda empeore y para prevenir la rigidez. Evite el reposo en cama, ya que puede administrator, arts.  Puede tomar hasta 600 mg de ibuprofeno cada 6 horas segn sea necesario para el dolor. No exceda los 2,4 g de ibuprofeno al da.  Puede tomar hasta 1000 mg de paracetamol cada 6 horas segn sea necesario para el dolor. No tome ms de 4 g al da.  Puede usar una compresa caliente en la espalda para engineer, materials.  Se le ha recetado un relajante muscular llamado Flexeril  (ciclobenzaprina). Puede tomar de 0,5 a 1 comprimido (5-10 mg) antes de acostarse segn sea necesario para el dolor muscular. Este medicamento puede causar somnolencia. No conduzca ni opere maquinaria pesada despus de tomar [employer] corporation. No consuma alcohol ni tome otros medicamentos sedantes mientras est tomando este medicamento por razones de seguridad. Mantenga este medicamento fuera del alcance de los nios pequeos.  Le han recetado prednisona. Por favor, tome este medicamento segn lo indicado durante los prximos 7 das (40 mg los das 1 y 2, 30 mg los das 3 y 4, 20 mg los [address] 5 y 6, y 10 mg [employer] 7). Tome este medicamento por la maana con el desayuno, ya que tomarlo por la noche puede dificultar el sueo. Si es diabtico, controle sus niveles de azcar en sangre con frecuencia mientras toma este medicamento, ya que puede provocar un aumento de la glucosa.  Por favor, programe una cita de seguimiento con su mdico de cabecera para una nueva evaluacin y un manejo posterior dentro de la prxima semana.  Regrese a la sala de emergencias si presenta incontinencia intestinal o urinaria, fiebre, entumecimiento en la ingle o cualquier otro sntoma nuevo o preocupante.  Your back and right leg pain seems to be due to an irritated nerve (sciatica).  Please  engage in light physical activity (like walking) to prevent your back pain from worsening and to prevent stiffness. Refrain from bedrest which can make your pain worse.   You may use up to 600mg  ibuprofen every 6 hours as needed for pain.  Do not exceed 2.4g of ibuprofen per day.    You may take up to 1000mg  of tylenol  every 6 hours as needed for pain.  Do not take more then 4g per day.    You may use a heating pack on your back to help with the pain.  You have been prescribed a muscle relaxer called Flexeril  (cyclobenzaprine ). You may take 0.5 - 1 tablet (5-10mg ) before bed as needed for muscle pain. This medication can be sedating. Do not drive or operate heavy machinery after taking this medicine. Do not drink alcohol or take other sedating medications when taking this medicine for safety reasons.  Keep this out of reach of small children.    You have been prescribed prednisone . Please take this medication as prescribed for the next 7 days (40mg  on days 1 and 2, 30mg  on days 3 and 4, 20mg  on days 5 and 6, 10mg  on day 7). Take this medication in the morning with breakfast, as taking it at night may make it hard to sleep. If you are a diabetic, please monitor your blood sugars closely on this medication, as it can cause your blood sugar to rise.  Please schedule follow-up appointment with your PCP for repeat evaluation  and further management within the next week.  Return to the ER if you have loss of bowel or bladder control, you develop fever, you have numbness in your groin, or if you have any other new or concerning symptoms.  X-ray lumbar spine results: EXAM:  LUMBAR SPINE - COMPLETE 4+ VIEW    COMPARISON:  None Available.    FINDINGS:  Suspected diminutive ribs at T12 with 5 non-rib-bearing lumbar  vertebra. Normal lumbar alignment. Normal vertebral body heights. No  fracture. No evidence of pars defects or focal bone abnormalities.  Trace anterior spurring at multiple levels with  preservation of disc  spaces. Mild lower lumbar facet hypertrophy. The sacroiliac joints  are congruent    IMPRESSION:  1. Mild lower lumbar facet hypertrophy.  2. Trace spondylosis with anterior spurring at multiple levels.      Electronically Signed    By: Andrea Gasman M.D.    On: 10/26/2024 19:05

## 2024-10-26 NOTE — ED Triage Notes (Signed)
 Per interpreter [phone]: pt c/o pain in lower RT side back with radiation down RLE; hx of same intermittently x years

## 2024-10-26 NOTE — ED Notes (Signed)
 Patient transported to X-ray and back without event.

## 2024-10-26 NOTE — ED Notes (Signed)
 ED Provider at bedside.

## 2024-10-26 NOTE — ED Provider Notes (Signed)
 " [place] [facility] Provider Note   CSN: [id number] Arrival date & time: 10/26/24  1612     Patient presents with: Back Pain   Courtney Summers is a 58 y.o. female with history of chronic lower back pain, presents with concern for ongoing lower back pain.  States that she has right lower back pain that radiates into her right leg.  States that this has been ongoing for years.  She reports that a couple days ago, she picked up one of her grandsons and the pain seemed to worsen.  She denies any bowel or bladder continence, urinary tension, saddle anesthesia, no history of IV drug use or malignancy.  Denies any fever or chills.  Denies any dysuria, hematuria, increased frequency.  No flank pain.  She reports she is taking gabapentin, ibuprofen, and hydrocodone at home without improvement in pain.  She currently follows with her PCP through [facility] for her back pain.  Reports she has tried a course of physical therapy without significant improvement in symptoms.  History obtained through use of Spanish interpreter    Back Pain      Prior to Admission medications  Medication Sig Start Date End Date Taking? Authorizing Provider  cyclobenzaprine  (FLEXERIL ) 10 MG tablet Take 0.5-1 tablets (5-10 mg total) by mouth at bedtime as needed for muscle spasms. 10/26/24  Yes Veta Palma, PA-C  lidocaine  (LIDODERM ) 5 % Place 1 patch onto the skin daily. Remove & Discard patch within 12 hours or as directed by MD 10/26/24  Yes Veta Palma, PA-C  predniSONE  (DELTASONE ) 10 MG tablet Take 4 tablets (40 mg total) by mouth daily with breakfast for 2 days, THEN 3 tablets (30 mg total) daily with breakfast for 2 days, THEN 2 tablets (20 mg total) daily with breakfast for 2 days, THEN 1 tablet (10 mg total) daily with breakfast for 1 day. 10/27/24 11/03/24 Yes Veta Palma, PA-C  albuterol  (VENTOLIN  HFA) 108 (90 Base) MCG/ACT inhaler Inhale 2 puffs  into the lungs every 6 (six) hours as needed for wheezing or shortness of breath. 04/22/20   Delores Raford SAILOR, MD  ciprofloxacin  (CILOXAN ) 0.3 % ophthalmic solution Place 1 drop into both eyes every 2 (two) hours. Administer 1 drop, every 2 hours, while awake, for 2 days. Then 1 drop, every 4 hours, while awake, for the next 5 days. 04/17/21   Cuthriell, Dorn BIRCH, PA-C    Allergies: B complex-b12    Review of Systems  Musculoskeletal:  Positive for back pain.    Updated Vital Signs BP 120/75 (BP Location: Right Arm)   Pulse 80   Temp 97.8 F (36.6 C) (Oral)   Resp (!) 21   Ht 4' 11 (1.499 m)   Wt 67.1 kg   SpO2 100%   BMI 29.89 kg/m   Physical Exam Vitals and nursing note reviewed.  Constitutional:      General: She is not in acute distress.    Appearance: She is well-developed.  HENT:     Head: Normocephalic and atraumatic.  Eyes:     Conjunctiva/sclera: Conjunctivae normal.  Cardiovascular:     Rate and Rhythm: Normal rate and regular rhythm.     Heart sounds: No murmur heard.    Comments: 2+ pedal pulses bilaterally Pulmonary:     Effort: Pulmonary effort is normal. No respiratory distress.     Breath sounds: Normal breath sounds.  Abdominal:     Palpations: Abdomen is soft.  Tenderness: There is no abdominal tenderness.  Musculoskeletal:        General: No swelling.       Arms:     Cervical back: Neck supple.     Comments: No cervical, thoracic, or lumbar spinal tenderness to palpation  Patient tender over the right lumbar musculature and right gluteal muscle  Patient able to move upper and lower extremities through full range of motion without difficulty.  Ambulates without difficulty  No calf tenderness to palpation or lower extremity edema or skin changes  Skin:    General: Skin is warm and dry.     Capillary Refill: Capillary refill takes less than 2 seconds.  Neurological:     Mental Status: She is alert.     Comments: Symmetric and intact  sensation to the bilateral lower extremities  5/5 strength with resisted hip flexion and extension, knee flexion and extension, ankle plantarflexion and dorsiflexion bilaterally  Psychiatric:        Mood and Affect: Mood normal.     (all labs ordered are listed, but only abnormal results are displayed) Labs Reviewed - No data to display  EKG: None  Radiology: DG Lumbar Spine Complete Result Date: 10/26/2024 CLINICAL DATA:  Low back pain radiating down right lower extremity. EXAM: LUMBAR SPINE - COMPLETE 4+ VIEW COMPARISON:  None Available. FINDINGS: Suspected diminutive ribs at T12 with 5 non-rib-bearing lumbar vertebra. Normal lumbar alignment. Normal vertebral body heights. No fracture. No evidence of pars defects or focal bone abnormalities. Trace anterior spurring at multiple levels with preservation of disc spaces. Mild lower lumbar facet hypertrophy. The sacroiliac joints are congruent IMPRESSION: 1. Mild lower lumbar facet hypertrophy. 2. Trace spondylosis with anterior spurring at multiple levels. Electronically Signed   By: Andrea Gasman M.D.   On: 10/26/2024 19:05     Procedures   Medications Ordered in the ED  lidocaine  (LIDODERM ) 5 % 1 patch (1 patch Transdermal Patient Refused/Not Given 10/26/24 1847)  acetaminophen  (TYLENOL ) tablet 1,000 mg (1,000 mg Oral Patient Refused/Not Given 10/26/24 1847)                                    Medical Decision Making Amount and/or Complexity of Data Reviewed Radiology: ordered.  Risk OTC drugs. Prescription drug management.     Differential diagnosis includes but is not limited to Musculoskeletal pain, radiculopathy, spinal stenosis, herniated nucleus pulposis, fracture, cauda equina, epidural abscess, shingles, nephrolithiasis    ED Course:  Upon initial evaluation, patient appears in some pain, but no acute distress.  Normal vital signs.  On exam, she has reproducible tenderness over the right lumbar musculature  and right gluteal muscle.  No point tenderness over the cervical, thoracic, or lumbar spine.  No recent injuries.  Doubt any acute spinal fracture.  She denies any red flag symptoms such as bowel or bladder incontinence, urinary retention, saddle anesthesia, IV drug use, malignancy, fever or chills.  Doubt cauda equina, epidural abscess, or other emergent pathology.  No overlying skin change to suggest shingles.  She denies any urinary symptoms, doubt cystitis or pyelonephritis.  She is neurovascularly intact in the bilateral lower extremities.  Able to move her upper and lower extremities through full range of motion.  However, given ongoing pain for years patient would like to get a x-ray to ensure nothing has changed since her previous ones.   Lumbar x-rays obtained which did not show any  significant abnormalities.  Feel patient's pain is likely secondary to sciatica and lumbar strain.  Stable and appropriate for discharge home   Imaging Studies ordered: I ordered imaging studies including lumbar x-ray, I personally reviewed and interpreted results and agree with radiologist: IMPRESSION:  1. Mild lower lumbar facet hypertrophy.  2. Trace spondylosis with anterior spurring at multiple levels.     Medications Given: Tylenol  and lidocaine  patch ordered for patient  Impression: Right lower back pain with right-sided sciatica  Disposition:  Patient discharged home in good condition with instructions to schedule a follow-up appointment with her PCP within the next week for further management of her chronic back pain and further imaging if needed.  I discussed with patient that we can trial course of prednisone  (patient not diabetic) and Flexeril  at home.  She understands that Flexeril  may make her drowsy and do not drink alcohol or drive after taking this medication.   Return precautions given and patient verbalized understanding.    This chart was dictated using voice recognition software,  Dragon. Despite the best efforts of this provider to proofread and correct errors, errors may still occur which can change documentation meaning.       Final diagnoses:  Low back pain radiating to right lower extremity    ED Discharge Orders          Ordered    predniSONE  (DELTASONE ) 10 MG tablet  Q breakfast        10/26/24 1908    cyclobenzaprine  (FLEXERIL ) 10 MG tablet  At bedtime PRN        10/26/24 1908    lidocaine  (LIDODERM ) 5 %  Every 24 hours        10/26/24 1918               Veta Palma, [place] 10/26/24 1920  "
